# Patient Record
Sex: Male | Born: 1990 | Hispanic: Yes | Marital: Married | Smoking: Current some day smoker
Health system: Southern US, Community
[De-identification: ages and names within clinical notes are randomized; demographics above are authoritative.]

## PROBLEM LIST (undated history)

## (undated) DIAGNOSIS — E119 Type 2 diabetes mellitus without complications: Secondary | ICD-10-CM

---

## 2018-09-02 ENCOUNTER — Emergency Department: Payer: Worker's Compensation

## 2018-09-02 ENCOUNTER — Emergency Department
Admission: EM | Admit: 2018-09-02 | Discharge: 2018-09-02 | Disposition: A | Discharge status: Home / Self Care | Payer: Worker's Compensation | Attending: Emergency Medicine | Admitting: Emergency Medicine

## 2018-09-02 ENCOUNTER — Encounter: Payer: Self-pay | Admitting: Emergency Medicine

## 2018-09-02 DIAGNOSIS — F17200 Nicotine dependence, unspecified, uncomplicated: Secondary | ICD-10-CM | POA: Diagnosis not present

## 2018-09-02 DIAGNOSIS — W19XXXA Unspecified fall, initial encounter: Secondary | ICD-10-CM

## 2018-09-02 DIAGNOSIS — Y990 Civilian activity done for income or pay: Secondary | ICD-10-CM | POA: Diagnosis not present

## 2018-09-02 DIAGNOSIS — Y9389 Activity, other specified: Secondary | ICD-10-CM | POA: Diagnosis not present

## 2018-09-02 DIAGNOSIS — S0990XA Unspecified injury of head, initial encounter: Secondary | ICD-10-CM | POA: Diagnosis present

## 2018-09-02 DIAGNOSIS — E119 Type 2 diabetes mellitus without complications: Secondary | ICD-10-CM | POA: Insufficient documentation

## 2018-09-02 DIAGNOSIS — Y9289 Other specified places as the place of occurrence of the external cause: Secondary | ICD-10-CM | POA: Insufficient documentation

## 2018-09-02 DIAGNOSIS — W132XXA Fall from, out of or through roof, initial encounter: Secondary | ICD-10-CM | POA: Diagnosis not present

## 2018-09-02 HISTORY — DX: Type 2 diabetes mellitus without complications: E11.9

## 2018-09-02 LAB — GLUCOSE, CAPILLARY: Glucose-Capillary: 107 mg/dL — ABNORMAL HIGH (ref 70–99)

## 2018-09-02 NOTE — ED Provider Notes (Signed)
[facility] Emergency Department Provider Note  ____________________________________________   I have reviewed the triage vital signs and the nursing notes. Where available I have reviewed prior notes and, if possible and indicated, outside hospital notes.    HISTORY  Chief Complaint Fall    HPI Niquan Tozzi is a 27 y.o. male  Who presents today complaining of having fallen off a roof.  Fortunately, he was wearing a restraint.  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  He remembers falling.  After approximately 20 feet free full, the harness stopped him, he did swing in and bumped his head against the wall as he was stopping.  He did not pass out, he denies any other injury.  He was lowered gradually to the ground thereafter, and is here after hitting his head is a [employer]. issue.  Patient states he has not vomited he does not feel confused he has no change in vision or hearing no neurologic deficit no abdominal pain no injury or impact any other part of his body was able to walk in here with no difficulty he states    Past Medical History:  Diagnosis Date  . Diabetes mellitus without complication (HCC)     There are no active problems to display for this patient.   History reviewed. No pertinent surgical history.  Prior to Admission medications   Not on File    Allergies Patient has no known allergies.  No family history on file.  Social History Social History   Tobacco Use  . Smoking status: Current Some Day Smoker  . Smokeless tobacco: Never Used  Substance Use Topics  . Alcohol use: Yes    Comment: Social   . Drug use: Never    Review of Systems Constitutional: No fever/chills Eyes: No visual changes. ENT: No sore throat. No stiff neck no neck pain Cardiovascular: Denies chest pain. Respiratory: Denies shortness of breath. Gastrointestinal:   no vomiting.  No  diarrhea.  No constipation. Genitourinary: Negative for dysuria. Musculoskeletal: Negative lower extremity swelling Skin: Negative for rash. Neurological: Negative for severe headaches, focal weakness or numbness.   ____________________________________________   PHYSICAL EXAM:  VITAL SIGNS: ED Triage Vitals  Enc Vitals Group     BP 09/02/18 1027 (!) 141/82     Pulse Rate 09/02/18 1027 85     Resp 09/02/18 1027 17     Temp 09/02/18 1027 98.1 F (36.7 C)     Temp Source 09/02/18 1027 Oral     SpO2 09/02/18 1027 98 %     Weight 09/02/18 1028 278 lb (126.1 kg)     Height 09/02/18 1028 5\' 10"  (1.778 m)     Head Circumference --      Peak Flow --      Pain Score 09/02/18 1027 8     Pain Loc --      Pain Edu? --      Excl. in GC? --     Constitutional: Alert and oriented. Well appearing and in no acute distress. Eyes: Conjunctivae are normal Head: Hematoma to the occiput, no skull fracture palpated HEENT: No congestion/rhinnorhea. Mucous membranes are moist.  Oropharynx non-erythematous Neck:   Nontender with no meningismus, no masses, no stridor Cardiovascular: Normal rate, regular rhythm. Grossly normal heart sounds.  Good peripheral circulation. Respiratory: Normal respiratory effort.  No retractions. Lungs CTAB. Abdominal: Soft and nontender. No distention. No guarding no rebound  Back:  There is no focal tenderness or step off.  there is no midline tenderness there are no lesions noted. there is no CVA tenderness Musculoskeletal: No lower extremity tenderness, no upper extremity tenderness. No joint effusions, no DVT signs strong distal pulses no edema Neurologic:  Normal speech and language. No gross focal neurologic deficits are appreciated.  Skin:  Skin is warm, dry and intact. No rash noted. Psychiatric: Mood and affect are normal. Speech and behavior are normal.  ____________________________________________   LABS (all labs ordered are listed, but only abnormal  results are displayed)  Labs Reviewed  GLUCOSE, CAPILLARY - Abnormal; Notable for the following components:      Result Value   Glucose-Capillary 107 (*)    All other components within normal limits    Pertinent labs  results that were available during my care of the patient were reviewed by me and considered in my medical decision making (see chart for details). ____________________________________________  EKG  I personally interpreted any EKGs ordered by me or triage  ____________________________________________  RADIOLOGY  Pertinent labs & imaging results that were available during my care of the patient were reviewed by me and considered in my medical decision making (see chart for details). If possible, patient and/or family made aware of any abnormal findings.  Dg Chest 2 View  Result Date: 09/02/2018 CLINICAL DATA:  Fall from roof.  Chest pain. EXAM: CHEST - 2 VIEW COMPARISON:  None. FINDINGS: Heart and mediastinal contours are within normal limits. No focal opacities or effusions. No acute bony abnormality. No visible rib fracture or pneumothorax. IMPRESSION: No active cardiopulmonary disease. Electronically Signed   By: Charlett Nose M.D.   On: 09/02/2018 11:17   Ct Head Wo Contrast  Result Date: 09/02/2018 CLINICAL DATA:  Pain following fall EXAM: CT HEAD WITHOUT CONTRAST CT CERVICAL SPINE WITHOUT CONTRAST TECHNIQUE: Multidetector CT imaging of the head and cervical spine was performed following the standard protocol without intravenous contrast. Multiplanar CT image reconstructions of the cervical spine were also generated. COMPARISON:  None. FINDINGS: CT HEAD FINDINGS Brain: The ventricles are normal in size and configuration. There is no intracranial mass, hemorrhage, extra-axial fluid collection, or midline shift. Brain parenchyma appears unremarkable. No acute infarct evident. Vascular: No hyperdense vessel. There is no appreciable vascular calcification. Skull: The bony  calvarium appears intact. Sinuses/Orbits: Visualized paranasal sinuses are clear. Visualized orbits appear symmetric bilaterally. Other: Mastoid air cells are clear. CT CERVICAL SPINE FINDINGS Alignment: There is no spondylolisthesis. Skull base and vertebrae: Skull base and craniocervical junction regions appear normal. No evident fracture. No blastic or lytic bone lesions. Soft tissues and spinal canal: Prevertebral soft tissues and predental space regions are normal. There is no paraspinous lesion. There is no cord or canal hematoma. Disc levels: Disk spaces appear unremarkable. No nerve root edema or effacement evident. No disc extrusion or stenosis. Upper chest: Visualized upper lung zones are clear. Other: None IMPRESSION: CT head: Study within normal limits. CT cervical spine: No fracture or spondylolisthesis. No appreciable arthropathy. Electronically Signed   By: Bretta Bang III M.D.   On: 09/02/2018 11:20   Ct Cervical Spine Wo Contrast  Result Date: 09/02/2018 CLINICAL DATA:  Pain following fall EXAM: CT HEAD WITHOUT CONTRAST CT CERVICAL SPINE WITHOUT CONTRAST TECHNIQUE: Multidetector CT imaging of the head and cervical spine was performed following the standard protocol without intravenous contrast. Multiplanar CT image reconstructions of the cervical spine were also generated. COMPARISON:  None. FINDINGS: CT HEAD FINDINGS Brain: The  ventricles are normal in size and configuration. There is no intracranial mass, hemorrhage, extra-axial fluid collection, or midline shift. Brain parenchyma appears unremarkable. No acute infarct evident. Vascular: No hyperdense vessel. There is no appreciable vascular calcification. Skull: The bony calvarium appears intact. Sinuses/Orbits: Visualized paranasal sinuses are clear. Visualized orbits appear symmetric bilaterally. Other: Mastoid air cells are clear. CT CERVICAL SPINE FINDINGS Alignment: There is no spondylolisthesis. Skull base and vertebrae: Skull  base and craniocervical junction regions appear normal. No evident fracture. No blastic or lytic bone lesions. Soft tissues and spinal canal: Prevertebral soft tissues and predental space regions are normal. There is no paraspinous lesion. There is no cord or canal hematoma. Disc levels: Disk spaces appear unremarkable. No nerve root edema or effacement evident. No disc extrusion or stenosis. Upper chest: Visualized upper lung zones are clear. Other: None IMPRESSION: CT head: Study within normal limits. CT cervical spine: No fracture or spondylolisthesis. No appreciable arthropathy. Electronically Signed   By: Bretta BangWilliam  Woodruff III M.D.   On: 09/02/2018 11:20   ____________________________________________    PROCEDURES  Procedure(s) performed: None  Procedures  Critical Care performed: None  ____________________________________________   INITIAL IMPRESSION / ASSESSMENT AND PLAN / ED COURSE  Pertinent labs & imaging results that were available during my care of the patient were reviewed by me and considered in my medical decision making (see chart for details).  Given mechanism we did do a CT scan of the head and neck which are negative, chest x-ray on the trauma protocol basis was also obtained which is reassuring.  No evidence of midline tenderness to the spine, and he is neurologically intact, he has no evidence on serial exams of any abdominal discomfort, tertiary trauma exam is negative.  Patient is laughing and joking with no evidence of significant concussion I did give him concussion instructions however purely based on his mechanism.  He is eager to go home and has no other further complaints.  We will give him some Tylenol for the bump in his head, return precautions follow-up given and understood.  It is very fortunate to be this well off after his fall and I did applaud him and encouraged him to continue wearing his harness    ____________________________________________   FINAL  CLINICAL IMPRESSION(S) / ED DIAGNOSES  Final diagnoses:  None      This chart was dictated using voice recognition software.  Despite best efforts to proofread,  errors can occur which can change meaning.      Jeanmarie PlantMcShane, Glorene Leitzke A, MD 09/02/18 1210

## 2018-09-02 NOTE — ED Triage Notes (Addendum)
Patient presents to ED via POV from work post fall. 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 Patient denies LOC or ever hitting the floor of the building. Patient reports wearing his hard hat but it flew off during the fall. Patient did hit the back of his head. Patient ambulatory on arrival. A&O x4. GCS 15. Patient reports dizziness but states that is not what made him fall.

## 2018-09-02 NOTE — Discharge Instructions (Addendum)
If you have any symptoms or pains that you think test we may have missed something here or something shows up later such as severe pain in your abdomen difficulty breathing or any bony tenderness in your arms or legs, or if you feel worse in any way please return to the emergency department.  Otherwise, follow closely with primary care and/or employee health doctor.  We do note that you did suffer head injury at this time there is no evidence of the significant concussion, however, we do recommend that you be careful about hitting her head, it would not be unusual for you to have minor headaches and some dizziness and head pain for the next week or so, if it persist longer than that or gets worse, seek help through your employee health for possible concussion symptoms.  If you have a severe headache or numbness or weakness or persistent vomiting return to the emergency room

## 2018-09-02 NOTE — ED Triage Notes (Signed)
First Nurse Note:  Larey SeatFell off of a roof this morning.  Patient c/o dizziness.  AAOx3.  Skin warm and dry. NAD

## 2018-09-02 NOTE — ED Notes (Signed)
Patient states he works for [employer].  Looked for WC profile, company not listed.

## 2019-05-08 IMAGING — CT CT CERVICAL SPINE W/O CM
4 of 7 series · 14 of 33 positions shown, 15 images · non-contrast
Comparison: None.

CLINICAL DATA: Pain following fall

EXAM:
CT HEAD WITHOUT CONTRAST
CT CERVICAL SPINE WITHOUT CONTRAST
TECHNIQUE: Multidetector CT imaging of the head and cervical spine was
performed following the standard protocol without intravenous
contrast. Multiplanar CT image reconstructions of the cervical spine
were also generated.

[Series 4: coronal soft tissue · coronal · 0.31mm/px · 2 of 62 slices shown]
[im 21/62  bone]
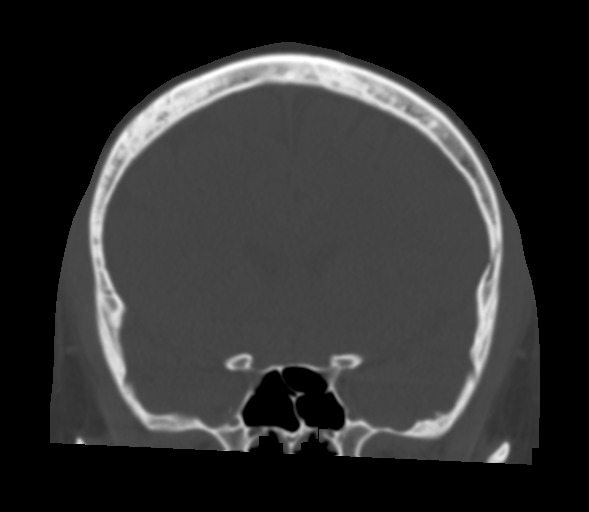
[im 41/62  bone]
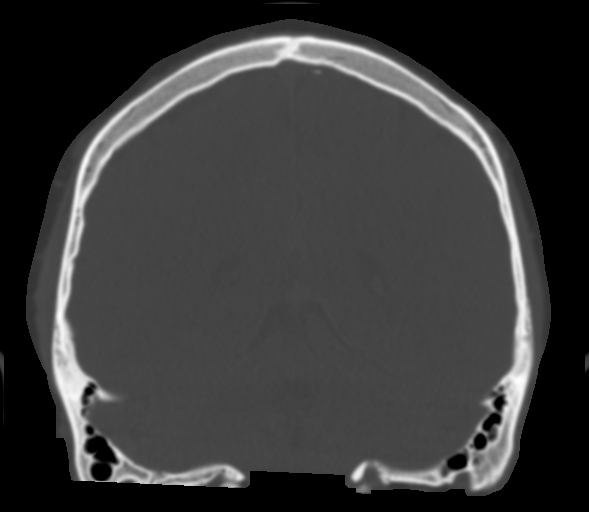

[Series 7: c spine soft · axial · 0.32mm/px · z∈[+104,+204]mm · 4 of 84 slices shown]
[im 17/84  soft-tissue]
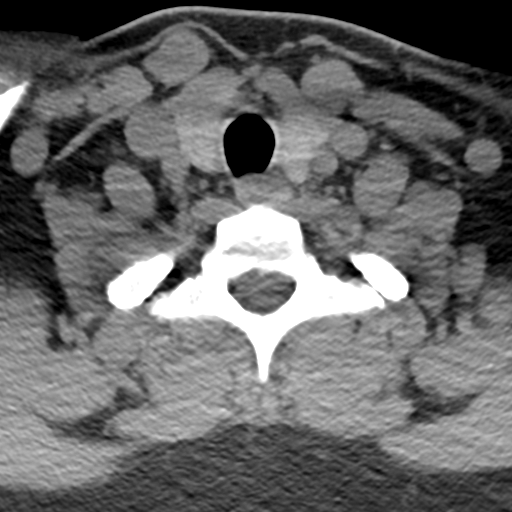
[im 34/84  soft-tissue]
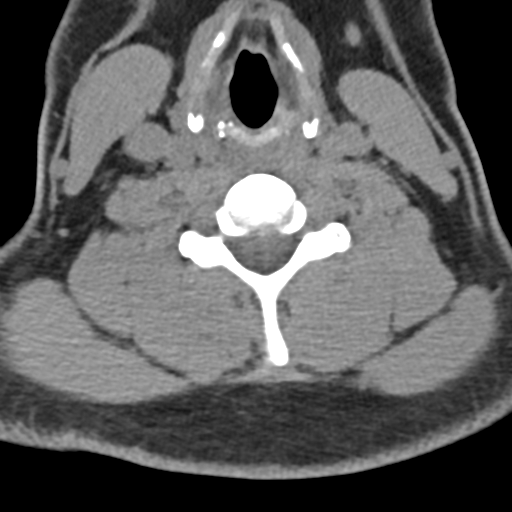
[im 50/84  soft-tissue]
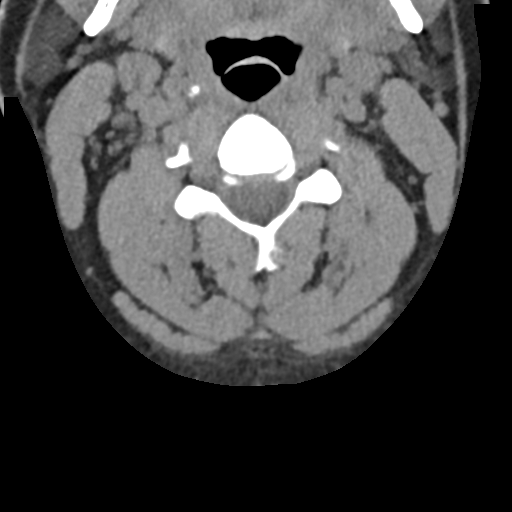
[im 67/84  soft-tissue]
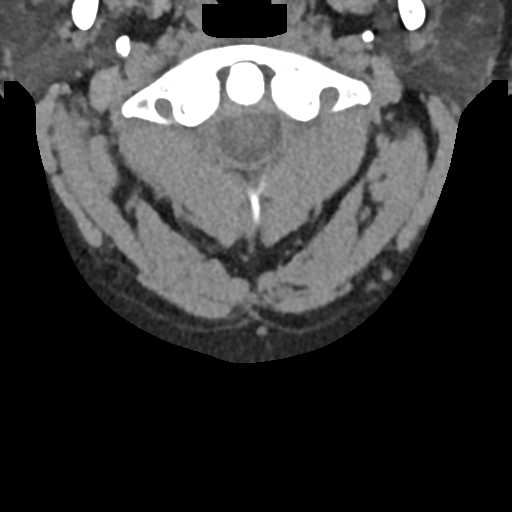

[Series 8: sagittal bone · sagittal · 0.25mm/px · 4 of 51 slices shown]
[im 11/51  bone]
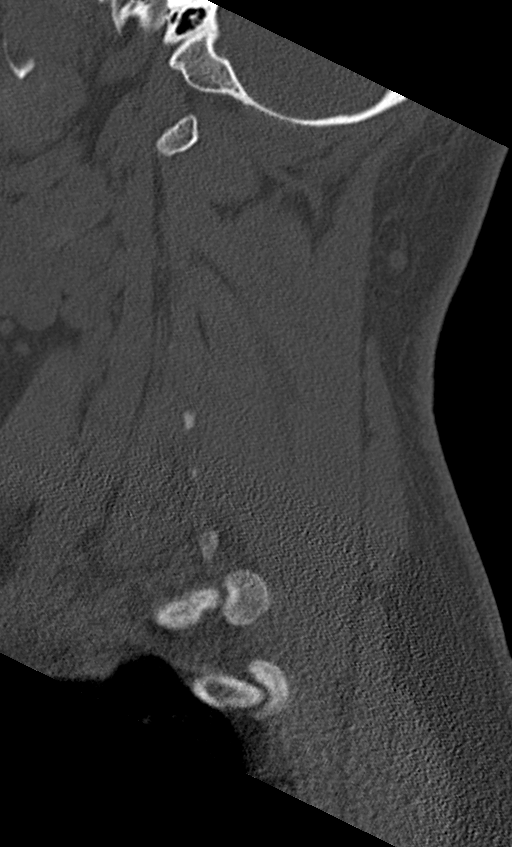
[im 21/51  bone]
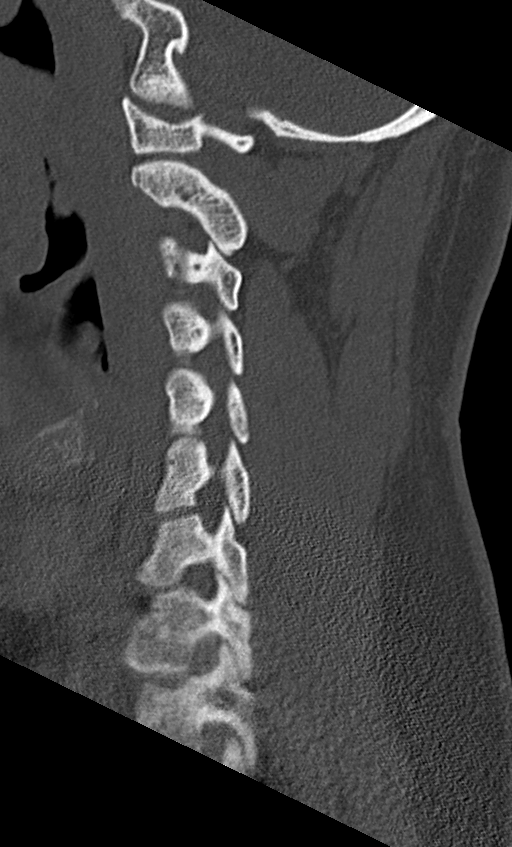
[im 31/51  bone]
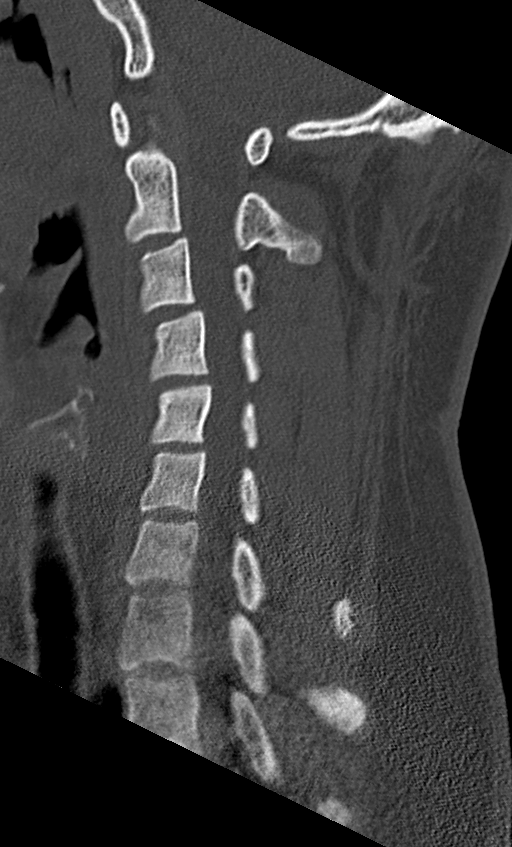
[im 41/51  bone]
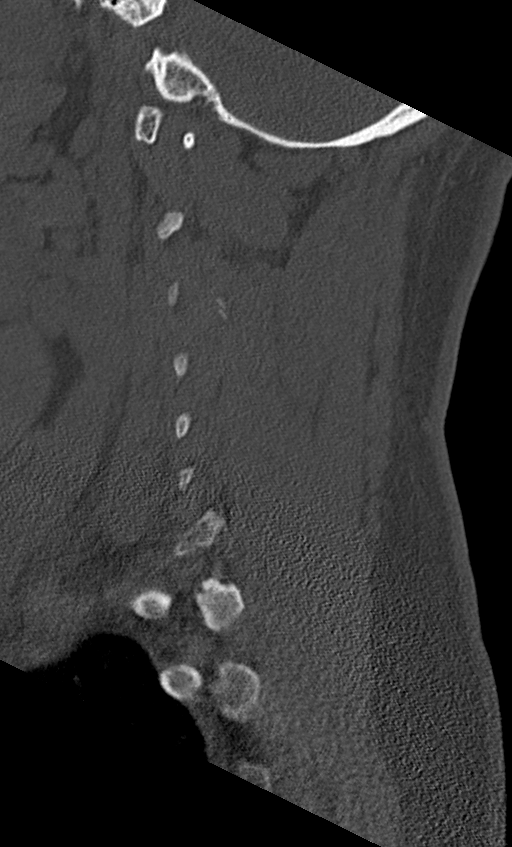

[Series 10: orthogonal bone · axial · 0.23mm/px · z∈[+88,+179]mm · 4 of 86 slices shown, 5 images]
[im 18/86  soft-tissue]
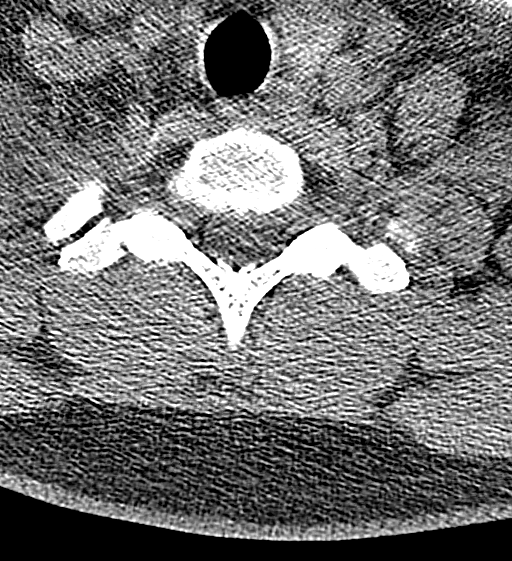
[im 18/86  bone]
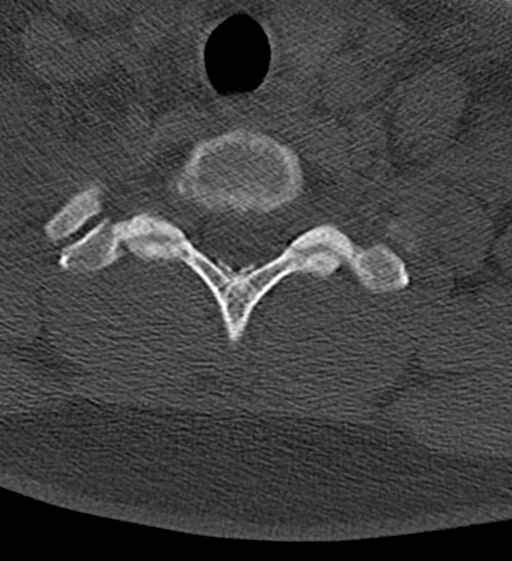
[im 35/86  bone]
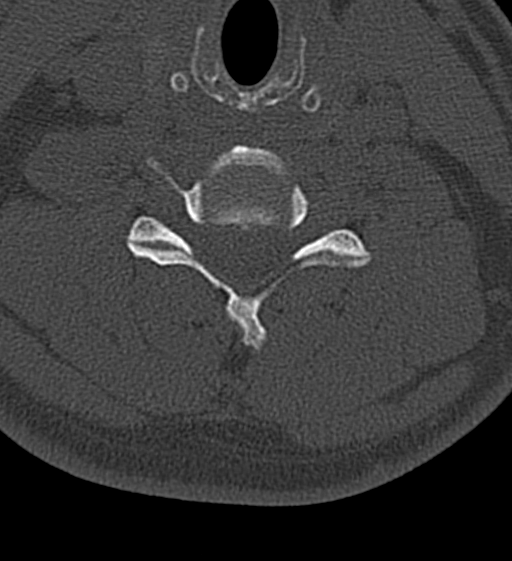
[im 52/86  bone]
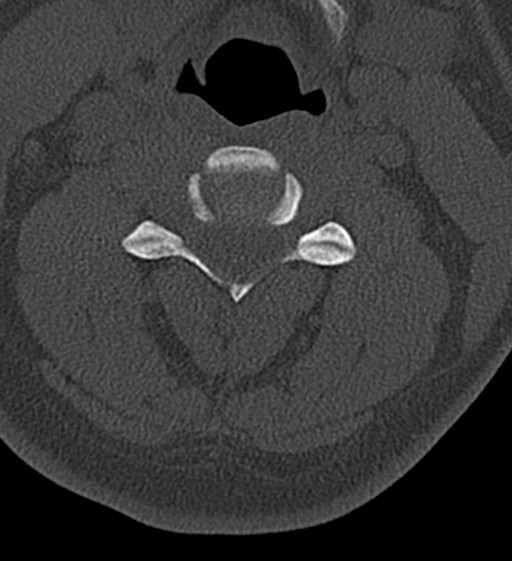
[im 69/86  bone]
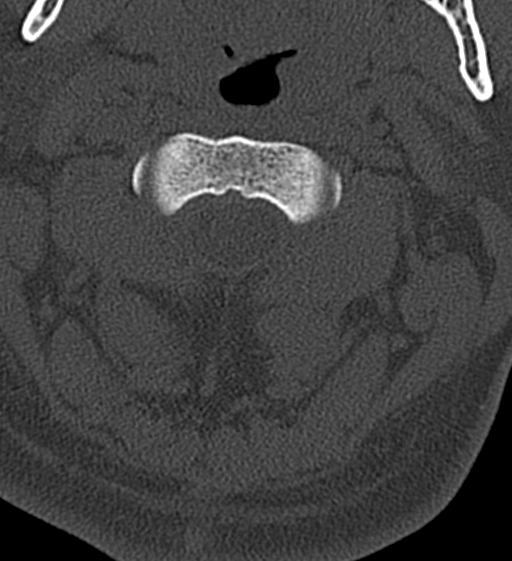

[14 of 33 positions shown; findings below may reference images not displayed]

FINDINGS: CT HEAD FINDINGS

Brain: The ventricles are normal in size and configuration. There is
no intracranial mass, hemorrhage, extra-axial fluid collection, or
midline shift. Brain parenchyma appears unremarkable. No acute
infarct evident.

Vascular: No hyperdense vessel. There is no appreciable vascular
calcification.

Skull: The bony calvarium appears intact.

Sinuses/Orbits: Visualized paranasal sinuses are clear. Visualized
orbits appear symmetric bilaterally.

Other: Mastoid air cells are clear.

CT CERVICAL SPINE FINDINGS

Alignment: There is no spondylolisthesis.

Skull base and vertebrae: Skull base and craniocervical junction
regions appear normal. No evident fracture. No blastic or lytic bone
lesions.

Soft tissues and spinal canal: Prevertebral soft tissues and
predental space regions are normal. There is no paraspinous lesion.
There is no cord or canal hematoma.

Disc levels: Disk spaces appear unremarkable. No nerve root edema or
effacement evident. No disc extrusion or stenosis.

Upper chest: Visualized upper lung zones are clear.

Other: None
IMPRESSION: CT head: Study within normal limits.

CT cervical spine: No fracture or spondylolisthesis. No appreciable
arthropathy.
